# Patient Record
Sex: Female | Born: 1990 | Hispanic: No | Marital: Single
Health system: Southern US, Community
[De-identification: ages and names within clinical notes are randomized; demographics above are authoritative.]

---

## 2016-01-18 ENCOUNTER — Other Ambulatory Visit: Payer: Self-pay | Admitting: Family Medicine

## 2016-01-18 ENCOUNTER — Ambulatory Visit
Admission: RE | Admit: 2016-01-18 | Discharge: 2016-01-18 | Disposition: A | Discharge status: Home / Self Care | Payer: BLUE CROSS/BLUE SHIELD | Source: Ambulatory Visit | Attending: Family Medicine | Admitting: Family Medicine

## 2016-01-18 DIAGNOSIS — M419 Scoliosis, unspecified: Secondary | ICD-10-CM

## 2017-08-28 ENCOUNTER — Other Ambulatory Visit (HOSPITAL_COMMUNITY)
Admission: RE | Admit: 2017-08-28 | Discharge: 2017-08-28 | Disposition: A | Discharge status: Home / Self Care | Payer: BLUE CROSS/BLUE SHIELD | Source: Ambulatory Visit | Attending: Family Medicine | Admitting: Family Medicine

## 2017-08-28 ENCOUNTER — Other Ambulatory Visit: Payer: Self-pay | Admitting: Family Medicine

## 2017-08-28 DIAGNOSIS — Z01411 Encounter for gynecological examination (general) (routine) with abnormal findings: Secondary | ICD-10-CM | POA: Insufficient documentation

## 2017-08-31 LAB — CYTOLOGY - PAP: Diagnosis: NEGATIVE

## 2018-04-30 DIAGNOSIS — J019 Acute sinusitis, unspecified: Secondary | ICD-10-CM | POA: Diagnosis not present

## 2018-04-30 DIAGNOSIS — H6122 Impacted cerumen, left ear: Secondary | ICD-10-CM | POA: Diagnosis not present

## 2018-08-02 DIAGNOSIS — H0102A Squamous blepharitis right eye, upper and lower eyelids: Secondary | ICD-10-CM | POA: Diagnosis not present

## 2018-08-30 DIAGNOSIS — N920 Excessive and frequent menstruation with regular cycle: Secondary | ICD-10-CM | POA: Diagnosis not present

## 2018-08-30 DIAGNOSIS — M4180 Other forms of scoliosis, site unspecified: Secondary | ICD-10-CM | POA: Diagnosis not present

## 2018-08-30 DIAGNOSIS — Z309 Encounter for contraceptive management, unspecified: Secondary | ICD-10-CM | POA: Diagnosis not present

## 2018-09-01 ENCOUNTER — Ambulatory Visit
Admission: RE | Admit: 2018-09-01 | Discharge: 2018-09-01 | Disposition: A | Discharge status: Home / Self Care | Payer: 59 | Source: Ambulatory Visit | Attending: Family Medicine | Admitting: Family Medicine

## 2018-09-01 ENCOUNTER — Other Ambulatory Visit: Payer: Self-pay | Admitting: Family Medicine

## 2018-09-01 DIAGNOSIS — M4180 Other forms of scoliosis, site unspecified: Secondary | ICD-10-CM

## 2018-09-01 DIAGNOSIS — M4185 Other forms of scoliosis, thoracolumbar region: Secondary | ICD-10-CM | POA: Diagnosis not present

## 2019-01-05 ENCOUNTER — Ambulatory Visit (HOSPITAL_COMMUNITY)
Admission: AD | Admit: 2019-01-05 | Discharge: 2019-01-05 | Disposition: A | Discharge status: Home / Self Care | Payer: 59 | Attending: Psychiatry | Admitting: Psychiatry

## 2019-01-05 ENCOUNTER — Encounter (HOSPITAL_COMMUNITY): Payer: Self-pay | Admitting: Behavioral Health

## 2019-01-05 DIAGNOSIS — F332 Major depressive disorder, recurrent severe without psychotic features: Secondary | ICD-10-CM | POA: Insufficient documentation

## 2019-01-05 NOTE — BH Assessment (Addendum)
Assessment Note  Sarah Tate is an 28 y.o. female.  The pt came in due to feeling depressed.  She stated her main stressors are being alone, irrational fear that she is going to lose her job and the start of a new relationship.  The pt stated her mood has been effecting her work and she hasn't been wanting to go to work.  She stated she feels worthless and is tearful.  She denies SI, HI and psychosis.  She was in counseling 2 years ago due to work stress.  She denies ever being inpatient of being on mental health medication.    The pt lives alone and she works at [employer].  She denies self harm, legal issues , history of abuse and hallucinations.  The pt reports she is sleeping about 3 hours a night and she has a good appetite.  She denies any SA use.  Pt is dressed in casual clothes. She is alert and oriented x4. Pt speaks in a clear tone, at moderate volume and normal pace. Eye contact is good. Pt's mood is depressed. Thought process is coherent and relevant. There is no indication Pt is currently responding to internal stimuli or experiencing delusional thought content.?Pt was cooperative throughout assessment.     Diagnosis: F33.2 Major depressive disorder, Recurrent episode, Severe   Past Medical History: No past medical history on file.   Family History: No family history on file.  Social History:  has no history on file for tobacco, alcohol, and drug.  Additional Social History:  Alcohol / Drug Use Pain Medications: See MAR Prescriptions: See MAR Over the Counter: See MAR History of alcohol / drug use?: No history of alcohol / drug abuse Longest period of sobriety (when/how long): NA  CIWA:   COWS:    Allergies: Allergies not on file  Home Medications: (Not in a hospital admission)   OB/GYN Status:  No LMP recorded.  General Assessment Data Location of Assessment: [facility] ED TTS Assessment: In system Is this a Tele or Face-to-Face Assessment?: Face-to-Face Is this an Initial  Assessment or a Re-assessment for this encounter?: Initial Assessment Patient Accompanied by:: N/A Language Other than English: No Living Arrangements: Other (Comment)(alone) What gender do you identify as?: Female Marital status: Single Maiden name: Lawley Pregnancy Status: No Living Arrangements: Alone Can pt return to current living arrangement?: Yes Admission Status: Voluntary Is patient capable of signing voluntary admission?: Yes Referral Source: Self/Family/Friend Insurance type: [place]     Crisis Care Plan Living Arrangements: Alone Legal Guardian: Other:(self) Name of Psychiatrist: none Name of Therapist: none  Education Status Is patient currently in school?: No Is the patient employed, unemployed or receiving disability?: Employed  Risk to self with the past 6 months Suicidal Ideation: No Has patient been a risk to self within the past 6 months prior to admission? : No Suicidal Intent: No Has patient had any suicidal intent within the past 6 months prior to admission? : No Is patient at risk for suicide?: No Suicidal Plan?: No Has patient had any suicidal plan within the past 6 months prior to admission? : No Access to Means: No What has been your use of drugs/alcohol within the last 12 months?: none Previous Attempts/Gestures: No How many times?: 0 Other Self Harm Risks: none Triggers for Past Attempts: None known Intentional Self Injurious Behavior: None Family Suicide History: No Recent stressful life event(s): Other (Comment)(being alone, new relationship) Persecutory voices/beliefs?: No Depression: Yes Depression Symptoms: Despondent, Insomnia, Tearfulness, Isolating, Guilt, Loss  of interest in usual pleasures, Feeling worthless/self pity Substance abuse history and/or treatment for substance abuse?: No Suicide prevention information given to non-admitted patients: Yes  Risk to Others within the past 6 months Homicidal Ideation: No Does patient have  any lifetime risk of violence toward others beyond the six months prior to admission? : No Thoughts of Harm to Others: No Current Homicidal Intent: No Current Homicidal Plan: No Access to Homicidal Means: No Identified Victim: pt denies History of harm to others?: No Assessment of Violence: None Noted Violent Behavior Description: pt denies Does patient have access to weapons?: No Criminal Charges Pending?: No Does patient have a court date: No Is patient on probation?: No  Psychosis Hallucinations: None noted Delusions: None noted  Mental Status Report Appearance/Hygiene: Unremarkable Eye Contact: Good Motor Activity: Unremarkable Speech: Logical/coherent Level of Consciousness: Alert Mood: Depressed Affect: Depressed Anxiety Level: None Thought Processes: Coherent, Relevant Judgement: Unimpaired Orientation: Person, Place, Time, Situation Obsessive Compulsive Thoughts/Behaviors: None  Cognitive Functioning Concentration: Normal Memory: Recent Intact, Remote Intact Is patient IDD: No Insight: Fair Impulse Control: Good Appetite: Poor Have you had any weight changes? : No Change Sleep: Decreased Total Hours of Sleep: 3 Vegetative Symptoms: None  ADLScreening [facility] Assessment Services) Patient's cognitive ability adequate to safely complete daily activities?: Yes Patient able to express need for assistance with ADLs?: Yes Independently performs ADLs?: Yes (appropriate for developmental age)  Prior Inpatient Therapy Prior Inpatient Therapy: No  Prior Outpatient Therapy Prior Outpatient Therapy: Yes Prior Therapy Dates: 2018 Prior Therapy Facilty/Provider(s): [facility] with job Reason for Treatment: stressed about work Does patient have an ACCT team?: No Does patient have Intensive In-House Services?  : No Does patient have [facility] services? : No Does patient have P4CC services?: No  ADL Screening (condition at time of admission) Patient's cognitive ability  adequate to safely complete daily activities?: Yes Patient able to express need for assistance with ADLs?: Yes Independently performs ADLs?: Yes (appropriate for developmental age)       Abuse/Neglect Assessment (Assessment to be complete while patient is alone) Abuse/Neglect Assessment Can Be Completed: Yes Physical Abuse: Denies Verbal Abuse: Denies Sexual Abuse: Denies Exploitation of patient/patient's resources: Denies Self-Neglect: Denies Values / Beliefs Cultural Requests During Hospitalization: None Spiritual Requests During Hospitalization: None Consults Spiritual Care Consult Needed: No Social Work Consult Needed: No            Disposition:  Disposition Initial Assessment Completed for this Encounter: Yes Disposition of Patient: Discharge Patient refused recommended treatment: No Mode of transportation if patient is discharged/movement?: Car   PA Patriciaann Clan recommends the pt be discharged.  The pt was given information on OPT.  On Site Evaluation by:   Reviewed with Physician:    Sarah Tate 01/05/2019 9:51 PM

## 2019-01-05 NOTE — H&P (Signed)
Behavioral Health Medical Screening Exam  Sarah Tate is an 28 y.o. female presenting with MDD, without reported self harm, SI/SA or HI concerns. She denies any acute and or chronic co-morbid conditions  Total Time spent with patient: 15 minutes  Psychiatric Specialty Exam: Physical Exam  Constitutional: She is oriented to person, place, and time. She appears well-developed and well-nourished. No distress.  HENT:  Head: Normocephalic.  Eyes: Pupils are equal, round, and reactive to light.  Respiratory: Effort normal and breath sounds normal. No respiratory distress.  Neurological: She is alert and oriented to person, place, and time. No cranial nerve deficit.  Skin: Skin is warm and dry. She is not diaphoretic.  Psychiatric: Her speech is normal. Judgment normal. She is withdrawn. Cognition and memory are normal. She exhibits a depressed mood. She expresses no homicidal and no suicidal ideation. She expresses no suicidal plans and no homicidal plans.    Review of Systems  Constitutional: Negative for chills, diaphoresis, fever, malaise/fatigue and weight loss.  Psychiatric/Behavioral: Positive for depression. Negative for hallucinations, memory loss, substance abuse and suicidal ideas. The patient is nervous/anxious. The patient does not have insomnia.     There were no vitals taken for this visit.There is no height or weight on file to calculate BMI.  General Appearance: Casual  Eye Contact:  Good  Speech:  Clear and Coherent  Volume:  Normal  Mood:  Depressed  Affect:  Congruent  Thought Process:  Goal Directed  Orientation:  Full (Time, Place, and Person)  Thought Content:  Logical  Suicidal Thoughts:  No  Homicidal Thoughts:  No  Memory:  Immediate;   Fair  Judgement:  Fair  Insight:  Fair  Psychomotor Activity:  Normal  Concentration: Concentration: Fair  Recall:  [employer] of Knowledge:Fair  Language: Fair  Akathisia:  Negative  Handed:  Right  AIMS (if indicated):      Assets:  Desire for Improvement  Sleep:       Musculoskeletal: Strength & Muscle Tone: within normal limits Gait & Station: normal Patient leans: N/A  There were no vitals taken for this visit.  Recommendations:  Based on my evaluation the patient does not appear to have an emergency medical condition.  Laverle Hobby, PA-C 01/05/2019, 9:19 PM

## 2019-04-27 DIAGNOSIS — D171 Benign lipomatous neoplasm of skin and subcutaneous tissue of trunk: Secondary | ICD-10-CM | POA: Diagnosis not present

## 2019-04-27 DIAGNOSIS — L723 Sebaceous cyst: Secondary | ICD-10-CM | POA: Diagnosis not present

## 2020-01-20 IMAGING — DX DG SCOLIOSIS EVAL COMPLETE SPINE 1V
1 series · 1 of 1 positions shown · non-contrast
Comparison: Radiographs January 18, 2016.

CLINICAL DATA: Low back pain, levoscoliosis.

EXAM:
DG SCOLIOSIS EVAL COMPLETE SPINE 1V

[dg scoliosis ap]
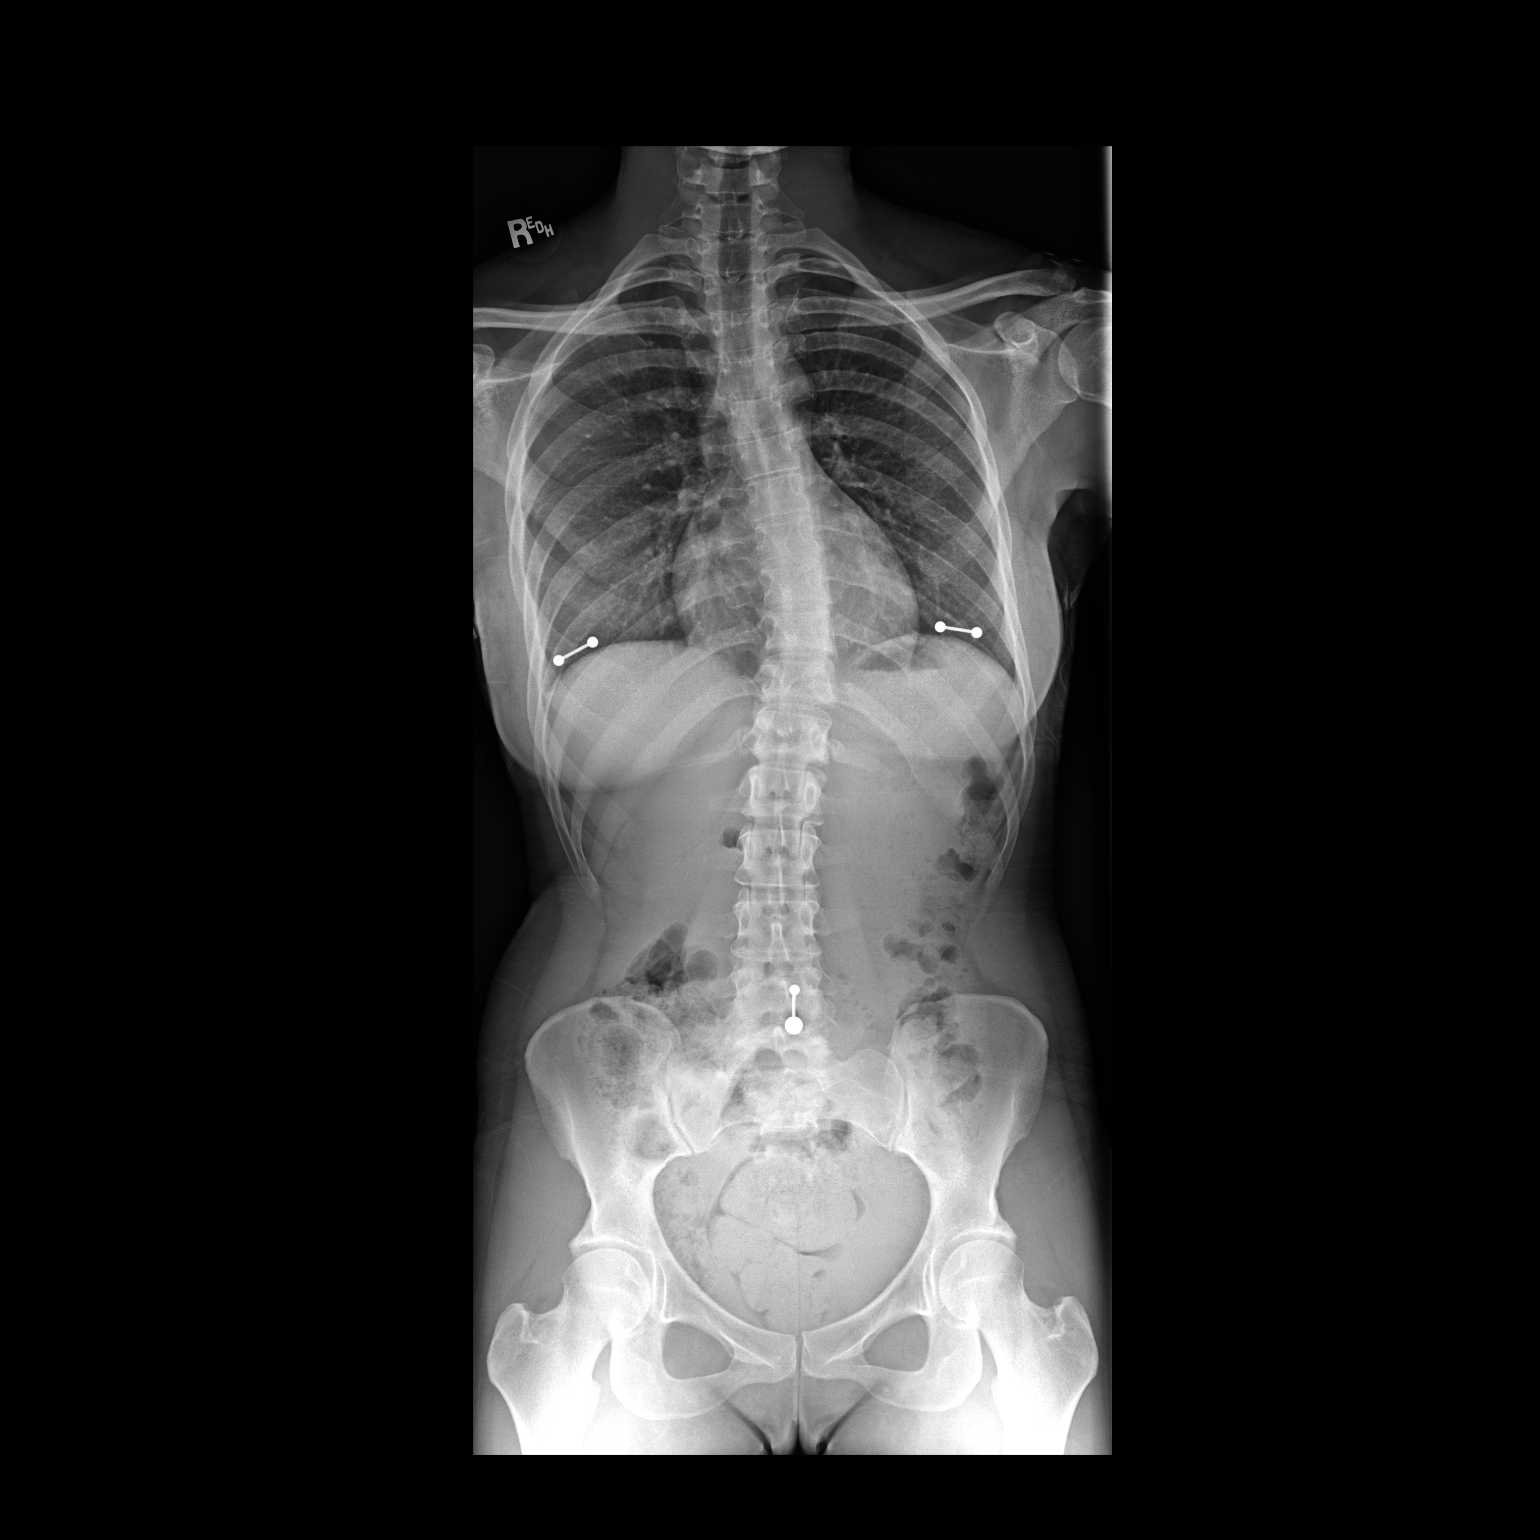

[1 of 1 positions shown; findings below may reference images not displayed]

FINDINGS: There is 21 degrees of levoscoliosis involving the lower thoracic
and upper lumbar spine centered at the T10 level. No fracture is
noted.
IMPRESSION: 21 degrees of levoscoliosis involving lower thoracic and upper
lumbar spine.

## 2020-09-11 ENCOUNTER — Other Ambulatory Visit: Payer: Self-pay | Admitting: Family Medicine

## 2020-09-11 ENCOUNTER — Other Ambulatory Visit (HOSPITAL_COMMUNITY)
Admission: RE | Admit: 2020-09-11 | Discharge: 2020-09-11 | Disposition: A | Discharge status: Home / Self Care | Payer: 59 | Source: Ambulatory Visit | Attending: Family Medicine | Admitting: Family Medicine

## 2020-09-11 DIAGNOSIS — Z01411 Encounter for gynecological examination (general) (routine) with abnormal findings: Secondary | ICD-10-CM | POA: Diagnosis not present

## 2020-09-21 LAB — CYTOLOGY - PAP
Comment: NEGATIVE
Diagnosis: UNDETERMINED — AB
High risk HPV: POSITIVE — AB

## 2021-12-05 ENCOUNTER — Other Ambulatory Visit: Payer: Self-pay | Admitting: Obstetrics and Gynecology

## 2022-09-22 DIAGNOSIS — B3731 Acute candidiasis of vulva and vagina: Secondary | ICD-10-CM | POA: Diagnosis not present

## 2022-09-22 DIAGNOSIS — Z23 Encounter for immunization: Secondary | ICD-10-CM | POA: Diagnosis not present

## 2023-02-23 DIAGNOSIS — Z13220 Encounter for screening for lipoid disorders: Secondary | ICD-10-CM | POA: Diagnosis not present

## 2023-02-23 DIAGNOSIS — Z0000 Encounter for general adult medical examination without abnormal findings: Secondary | ICD-10-CM | POA: Diagnosis not present

## 2023-02-23 DIAGNOSIS — Z23 Encounter for immunization: Secondary | ICD-10-CM | POA: Diagnosis not present

## 2023-02-23 DIAGNOSIS — E559 Vitamin D deficiency, unspecified: Secondary | ICD-10-CM | POA: Diagnosis not present

## 2023-03-30 DIAGNOSIS — F411 Generalized anxiety disorder: Secondary | ICD-10-CM | POA: Diagnosis not present

## 2023-04-06 DIAGNOSIS — F411 Generalized anxiety disorder: Secondary | ICD-10-CM | POA: Diagnosis not present

## 2023-04-13 DIAGNOSIS — F411 Generalized anxiety disorder: Secondary | ICD-10-CM | POA: Diagnosis not present

## 2023-04-27 DIAGNOSIS — F411 Generalized anxiety disorder: Secondary | ICD-10-CM | POA: Diagnosis not present

## 2023-05-22 DIAGNOSIS — F411 Generalized anxiety disorder: Secondary | ICD-10-CM | POA: Diagnosis not present

## 2023-05-29 DIAGNOSIS — F411 Generalized anxiety disorder: Secondary | ICD-10-CM | POA: Diagnosis not present
